# Patient Record
Sex: Male | Born: 1950 | Race: Black or African American | Hispanic: No | Marital: Single | Smoking: Former smoker
Health system: Southern US, Community
[De-identification: ages and names within clinical notes are randomized; demographics above are authoritative.]

## PROBLEM LIST (undated history)

## (undated) DIAGNOSIS — I10 Essential (primary) hypertension: Secondary | ICD-10-CM

## (undated) HISTORY — PX: FOOT SURGERY: SHX648

---

## 2011-10-25 ENCOUNTER — Emergency Department: Payer: Self-pay | Admitting: Emergency Medicine

## 2016-06-12 ENCOUNTER — Emergency Department
Admission: EM | Admit: 2016-06-12 | Discharge: 2016-06-12 | Disposition: A | Discharge status: Home / Self Care | Payer: Self-pay | Attending: Emergency Medicine | Admitting: Emergency Medicine

## 2016-06-12 ENCOUNTER — Encounter: Payer: Self-pay | Admitting: Emergency Medicine

## 2016-06-12 DIAGNOSIS — I10 Essential (primary) hypertension: Secondary | ICD-10-CM | POA: Insufficient documentation

## 2016-06-12 DIAGNOSIS — Z87891 Personal history of nicotine dependence: Secondary | ICD-10-CM | POA: Insufficient documentation

## 2016-06-12 DIAGNOSIS — E860 Dehydration: Secondary | ICD-10-CM | POA: Insufficient documentation

## 2016-06-12 DIAGNOSIS — T675XXA Heat exhaustion, unspecified, initial encounter: Secondary | ICD-10-CM | POA: Insufficient documentation

## 2016-06-12 HISTORY — DX: Essential (primary) hypertension: I10

## 2016-06-12 LAB — BASIC METABOLIC PANEL
ANION GAP: 9 (ref 5–15)
BUN: 17 mg/dL (ref 6–20)
CHLORIDE: 104 mmol/L (ref 101–111)
CO2: 25 mmol/L (ref 22–32)
Calcium: 9.2 mg/dL (ref 8.9–10.3)
Creatinine, Ser: 1.35 mg/dL — ABNORMAL HIGH (ref 0.61–1.24)
GFR calc Af Amer: >60 mL/min (ref >60)
GFR calc non Af Amer: 54 mL/min — ABNORMAL LOW (ref >60)
GLUCOSE: 98 mg/dL (ref 65–99)
POTASSIUM: 3.5 mmol/L (ref 3.5–5.1)
SODIUM: 138 mmol/L (ref 135–145)

## 2016-06-12 LAB — URINALYSIS, COMPLETE (UACMP) WITH MICROSCOPIC
Bacteria, UA: NONE SEEN
Bilirubin Urine: NEGATIVE
GLUCOSE, UA: NEGATIVE mg/dL
HGB URINE DIPSTICK: NEGATIVE
Ketones, ur: NEGATIVE mg/dL
LEUKOCYTES UA: NEGATIVE
NITRITE: NEGATIVE
PH: 5 (ref 5.0–8.0)
Protein, ur: NEGATIVE mg/dL
SPECIFIC GRAVITY, URINE: 1.017 (ref 1.005–1.030)

## 2016-06-12 LAB — CK: Total CK: 247 U/L (ref 49–397)

## 2016-06-12 LAB — CBC
HEMATOCRIT: 43.1 % (ref 40.0–52.0)
HEMOGLOBIN: 14.5 g/dL (ref 13.0–18.0)
MCH: 30.3 pg (ref 26.0–34.0)
MCHC: 33.7 g/dL (ref 32.0–36.0)
MCV: 89.7 fL (ref 80.0–100.0)
Platelets: 289 10*3/uL (ref 150–440)
RBC: 4.8 MIL/uL (ref 4.40–5.90)
RDW: 14 % (ref 11.5–14.5)
WBC: 7.4 10*3/uL (ref 3.8–10.6)

## 2016-06-12 MED ORDER — SODIUM CHLORIDE 0.9 % IV BOLUS (SEPSIS)
1000.0000 mL | Freq: Once | INTRAVENOUS | Status: AC
  Administered 2016-06-12: 1000 mL via INTRAVENOUS

## 2016-06-12 NOTE — ED Provider Notes (Signed)
[facility] Emergency Department Provider Note  ____________________________________________  Time seen: Approximately 8:17 PM  I have reviewed the triage vital signs and the nursing notes.   HISTORY  Chief Complaint Near Syncope and Diarrhea    HPI Terry SorrowJoseph M Kolarik Jr. is a 66 y.o. male who complains of an episode of getting dizzy while mowing the lawn today. He reports that he had not eaten anything today and had been drinking some warm water while outside on 90 a day mowing the grass for about 3 hours. He also had some loose bowel movements and felt real dizzy. He did not pass out. No chest pain shortness of breath back pain abdominal pain. No headache or numbness tingling weakness or vision changes. He just feels weak all over. No falls or trauma.     Past Medical History:  Diagnosis Date  . Hypertension      There are no active problems to display for this patient.    Past Surgical History:  Procedure Laterality Date  . FOOT SURGERY       Prior to Admission medications   Not on File     Allergies Other   No family history on file.  Social History Social History  Substance Use Topics  . Smoking status: Former Games developermoker  . Smokeless tobacco: Never Used  . Alcohol use No    Review of Systems  Constitutional:   No fever or chills.  ENT:   No sore throat. No rhinorrhea. Lymphatic: No swollen glands, No extremity swelling Endocrine: No hot/cold flashes. No significant weight change. No neck swelling. Cardiovascular:   No chest pain or syncope. Respiratory:   No dyspnea or cough. Gastrointestinal:   Negative for abdominal pain, vomiting and diarrhea.  Genitourinary:   Negative for dysuria or difficulty urinating. Musculoskeletal:   Negative for focal pain or swelling Neurological:   Negative for headaches or weakness. All other systems reviewed and are negative except as documented above in ROS and  HPI.  ____________________________________________   PHYSICAL EXAM:  VITAL SIGNS: ED Triage Vitals [06/12/16 1705]  Enc Vitals Group     BP (!) 113/93     Pulse Rate 98     Resp 18     Temp 99.6 F (37.6 C)     Temp Source Oral     SpO2 95 %     Weight 192 lb (87.1 kg)     Height 5' 11.5" (1.816 m)     Head Circumference      Peak Flow      Pain Score 0     Pain Loc      Pain Edu?      Excl. in GC?     Vital signs reviewed, nursing assessments reviewed.   Constitutional:   Alert and oriented. Well appearing and in no distress. Eyes:   No scleral icterus. No conjunctival pallor. PERRL. EOMI.  No nystagmus. ENT   Head:   Normocephalic and atraumatic.   Nose:   No congestion/rhinnorhea. No septal hematoma   Mouth/Throat:   MMM, no pharyngeal erythema. No peritonsillar mass.    Neck:   No stridor. No SubQ emphysema. No meningismus. Hematological/Lymphatic/Immunilogical:   No cervical lymphadenopathy. Cardiovascular:   RRR. Symmetric bilateral radial and DP pulses.  No murmurs.  Respiratory:   Normal respiratory effort without tachypnea nor retractions. Breath sounds are clear and equal bilaterally. No wheezes/rales/rhonchi. Gastrointestinal:   Soft and nontender. Non distended. There is no CVA tenderness.  No  rebound, rigidity, or guarding. Genitourinary:   deferred Musculoskeletal:   Normal range of motion in all extremities. No joint effusions.  No lower extremity tenderness.  No edema. Neurologic:   Normal speech and language.  CN 2-10 normal. Motor grossly intact. No gross focal neurologic deficits are appreciated.  Skin:    Skin is warm, dry and intact. No rash noted.  No petechiae, purpura, or bullae.  ____________________________________________    LABS (pertinent positives/negatives) (all labs ordered are listed, but only abnormal results are displayed) Labs Reviewed  BASIC METABOLIC PANEL - Abnormal; Notable for the following:       Result  Value   Creatinine, Ser 1.35 (*)    GFR calc non Af Amer 54 (*)    All other components within normal limits  URINALYSIS, COMPLETE (UACMP) WITH MICROSCOPIC - Abnormal; Notable for the following:    Color, Urine YELLOW (*)    APPearance CLEAR (*)    Squamous Epithelial / LPF 0-5 (*)    All other components within normal limits  CBC  CK  CBG MONITORING, ED   ____________________________________________   EKG  Interpreted by me Normal sinus rhythm rate of 89, normal axis intervals QRS ST segments and T waves.  ____________________________________________    RADIOLOGY  No results found.  ____________________________________________   PROCEDURES Procedures  ____________________________________________   INITIAL IMPRESSION / ASSESSMENT AND PLAN / ED COURSE  Pertinent labs & imaging results that were available during my care of the patient were reviewed by me and considered in my medical decision making (see chart for details).  Patient well appearing no acute distress, exam unremarkable vital signs normal. Likely heat exhaustion and dehydration. We'll give IV fluids, check orthostatics, check labs and CK.  ----------------------------------------- 8:18 PM on 06/12/2016 -----------------------------------------  Blood pressure increased at baseline which is mildly elevated. Orthostatics negative. Labs unremarkable. We'll discharge home.         ____________________________________________   FINAL CLINICAL IMPRESSION(S) / ED DIAGNOSES  Final diagnoses:  Dehydration  Heat exhaustion, initial encounter      New Prescriptions   No medications on file     Portions of this note were generated with dragon dictation software. Dictation errors may occur despite best attempts at proofreading.    Sharman Cheek, MD 06/12/16 2019

## 2016-06-12 NOTE — ED Triage Notes (Signed)
Patient presents to the ED after an episode where he almost passed out after mowing the lawn.  Patient reports 3 episodes of diarrhea today and states he has not had anything to eat.  Patient denies any pain.  Denies vomiting.

## 2018-07-09 ENCOUNTER — Emergency Department: Payer: Medicare Other

## 2018-07-09 ENCOUNTER — Other Ambulatory Visit: Payer: Self-pay

## 2018-07-09 ENCOUNTER — Emergency Department
Admission: EM | Admit: 2018-07-09 | Discharge: 2018-07-09 | Disposition: A | Discharge status: Home / Self Care | Payer: Medicare Other | Attending: Emergency Medicine | Admitting: Emergency Medicine

## 2018-07-09 DIAGNOSIS — Z87891 Personal history of nicotine dependence: Secondary | ICD-10-CM | POA: Insufficient documentation

## 2018-07-09 DIAGNOSIS — Y92410 Unspecified street and highway as the place of occurrence of the external cause: Secondary | ICD-10-CM | POA: Insufficient documentation

## 2018-07-09 DIAGNOSIS — Y999 Unspecified external cause status: Secondary | ICD-10-CM | POA: Diagnosis not present

## 2018-07-09 DIAGNOSIS — S161XXA Strain of muscle, fascia and tendon at neck level, initial encounter: Secondary | ICD-10-CM | POA: Insufficient documentation

## 2018-07-09 DIAGNOSIS — Y9389 Activity, other specified: Secondary | ICD-10-CM | POA: Diagnosis not present

## 2018-07-09 DIAGNOSIS — I10 Essential (primary) hypertension: Secondary | ICD-10-CM | POA: Diagnosis not present

## 2018-07-09 DIAGNOSIS — M25511 Pain in right shoulder: Secondary | ICD-10-CM | POA: Insufficient documentation

## 2018-07-09 DIAGNOSIS — S39012A Strain of muscle, fascia and tendon of lower back, initial encounter: Secondary | ICD-10-CM

## 2018-07-09 DIAGNOSIS — S199XXA Unspecified injury of neck, initial encounter: Secondary | ICD-10-CM | POA: Diagnosis present

## 2018-07-09 MED ORDER — MELOXICAM 7.5 MG PO TABS: 7.5000 mg | ORAL_TABLET | Freq: Every day | ORAL | 0 refills | Status: AC

## 2018-07-09 MED ORDER — MELOXICAM 7.5 MG PO TABS
7.5000 mg | ORAL_TABLET | Freq: Once | ORAL | Status: AC
  Administered 2018-07-09: 7.5 mg via ORAL
  Filled 2018-07-09: qty 1

## 2018-07-09 MED ORDER — METHOCARBAMOL 500 MG PO TABS: 500.0000 mg | ORAL_TABLET | Freq: Four times a day (QID) | ORAL | 0 refills | Status: AC
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
## 2018-07-14 ENCOUNTER — Other Ambulatory Visit: Payer: Self-pay

## 2018-07-14 ENCOUNTER — Emergency Department
Admission: EM | Admit: 2018-07-14 | Discharge: 2018-07-14 | Disposition: A | Discharge status: Home / Self Care | Payer: No Typology Code available for payment source | Attending: Emergency Medicine | Admitting: Emergency Medicine

## 2018-07-14 ENCOUNTER — Encounter: Payer: Self-pay | Admitting: Emergency Medicine

## 2018-07-14 DIAGNOSIS — Z79899 Other long term (current) drug therapy: Secondary | ICD-10-CM | POA: Diagnosis not present

## 2018-07-14 DIAGNOSIS — Z87891 Personal history of nicotine dependence: Secondary | ICD-10-CM | POA: Diagnosis not present

## 2018-07-14 DIAGNOSIS — M7918 Myalgia, other site: Secondary | ICD-10-CM | POA: Insufficient documentation

## 2018-07-14 DIAGNOSIS — I10 Essential (primary) hypertension: Secondary | ICD-10-CM | POA: Insufficient documentation

## 2018-07-14 MED ORDER — KETOROLAC TROMETHAMINE 30 MG/ML IJ SOLN
30.0000 mg | Freq: Once | INTRAMUSCULAR | Status: AC
  Administered 2018-07-14: 11:00:00 30 mg via INTRAMUSCULAR
  Filled 2018-07-14: qty 1

## 2018-07-14 MED ORDER — ORPHENADRINE CITRATE 30 MG/ML IJ SOLN
60.0000 mg | Freq: Two times a day (BID) | INTRAMUSCULAR | Status: DC
  Administered 2018-07-14: 11:00:00 60 mg via INTRAMUSCULAR
  Filled 2018-07-14: qty 2
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
## 2018-07-14 NOTE — Discharge Instructions (Addendum)
Follow discharge care instruction and start medication as directed.
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
## 2018-07-18 ENCOUNTER — Encounter: Payer: Self-pay | Admitting: Emergency Medicine

## 2018-07-18 ENCOUNTER — Emergency Department
Admission: EM | Admit: 2018-07-18 | Discharge: 2018-07-18 | Disposition: A | Discharge status: Home / Self Care | Payer: No Typology Code available for payment source | Attending: Student in an Organized Health Care Education/Training Program | Admitting: Student in an Organized Health Care Education/Training Program

## 2018-07-18 ENCOUNTER — Emergency Department: Payer: No Typology Code available for payment source

## 2018-07-18 ENCOUNTER — Other Ambulatory Visit: Payer: Self-pay

## 2018-07-18 DIAGNOSIS — Z87891 Personal history of nicotine dependence: Secondary | ICD-10-CM | POA: Insufficient documentation

## 2018-07-18 DIAGNOSIS — I10 Essential (primary) hypertension: Secondary | ICD-10-CM | POA: Diagnosis not present

## 2018-07-18 DIAGNOSIS — R51 Headache: Secondary | ICD-10-CM | POA: Diagnosis present

## 2018-07-18 DIAGNOSIS — G44209 Tension-type headache, unspecified, not intractable: Secondary | ICD-10-CM | POA: Diagnosis not present

## 2018-07-18 MED ORDER — DICLOFENAC SODIUM 25 MG PO TBEC: 25.0000 mg | DELAYED_RELEASE_TABLET | Freq: Two times a day (BID) | ORAL | 0 refills | Status: AC
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
## 2022-09-09 ENCOUNTER — Ambulatory Visit: Payer: Medicare PPO

## 2022-09-10 ENCOUNTER — Ambulatory Visit: Payer: Medicare PPO

## 2022-09-10 ENCOUNTER — Encounter: Payer: Self-pay | Admitting: Family Medicine

## 2022-09-10 ENCOUNTER — Ambulatory Visit: Payer: Medicare PPO | Admitting: Family Medicine

## 2022-09-10 DIAGNOSIS — Z202 Contact with and (suspected) exposure to infections with a predominantly sexual mode of transmission: Secondary | ICD-10-CM

## 2022-09-10 DIAGNOSIS — Z113 Encounter for screening for infections with a predominantly sexual mode of transmission: Secondary | ICD-10-CM

## 2022-09-10 LAB — HM HIV SCREENING LAB: HM HIV Screening: NEGATIVE

## 2022-09-10 MED ORDER — METRONIDAZOLE 500 MG PO TABS: 2000.0000 mg | ORAL_TABLET | Freq: Once | ORAL | Status: AC

## 2022-09-10 NOTE — Progress Notes (Signed)
[facility] Department STI clinic/screening visit  Subjective:  Rocket Wolfe. is a 72 y.o. male being seen today for an STI screening visit. The patient reports they do not have symptoms.    Patient has the following medical conditions:  There are no problems to display for this patient.    Chief Complaint  Patient presents with   SEXUALLY TRANSMITTED DISEASE    Contact to trich    HPI  Patient reports to clinic as a contact to trich and for STI testing  Last HIV test per patient/review of record was No results found for: "HMHIVSCREEN" No results found for: "HIV"  Does the patient or their partner desires a pregnancy in the next year? No  Screening for MPX risk: Does the patient have an unexplained rash? No Is the patient MSM? No Does the patient endorse multiple sex partners or anonymous sex partners? No Did the patient have close or sexual contact with a person diagnosed with MPX? No Has the patient traveled outside the [place] where MPX is endemic? No Is there a high clinical suspicion for MPX-- evidenced by one of the following No  -Unlikely to be chickenpox  -Lymphadenopathy  -Rash that present in same phase of evolution on any given body part   See flowsheet for further details and programmatic requirements.   Immunization History  Administered Date(s) Administered   Td (Adult),unspecified 03/04/2004   Tdap 06/10/2021     The following portions of the patient's history were reviewed and updated as appropriate: allergies, current medications, past medical history, past social history, past surgical history and problem list.  Objective:  There were no vitals filed for this visit.  Physical Exam Vitals and nursing note reviewed.  Constitutional:      Appearance: Normal appearance.  HENT:     Head: Normocephalic and atraumatic.     Mouth/Throat:     Mouth: Mucous membranes are moist.     Pharynx: No oropharyngeal exudate or posterior oropharyngeal  erythema.  Eyes:     General:        Right eye: No discharge.        Left eye: No discharge.     Conjunctiva/sclera:     Right eye: Right conjunctiva is not injected. No exudate.    Left eye: Left conjunctiva is not injected. No exudate. Pulmonary:     Effort: Pulmonary effort is normal.  Abdominal:     General: Abdomen is flat.     Palpations: Abdomen is soft. There is no hepatomegaly or mass.     Tenderness: There is no abdominal tenderness. There is no rebound.  Genitourinary:    Comments: Declined genital exam- asymptomatic Lymphadenopathy:     Cervical: No cervical adenopathy.     Upper Body:     Right upper body: No supraclavicular or axillary adenopathy.     Left upper body: No supraclavicular or axillary adenopathy.  Skin:    General: Skin is warm and dry.  Neurological:     Mental Status: He is alert and oriented to person, place, and time.       Assessment and Plan:  Alok Mezzanotte. is a 72 y.o. male presenting to the [facility] Department for STI screening  1. Exposure to trichomonas  - metroNIDAZOLE (FLAGYL) 500 MG tablet; Take 4 tablets (2,000 mg total) by mouth once for 1 dose.  2. Screening for venereal disease  - HIV [facility] - Syphilis Serology, [facility] -  Chlamydia/GC NAA, Confirmation   Patient does not have STI symptoms Patient accepted all screenings including  urine GC/Chlamydia, and blood work for HIV/Syphilis. Patient meets criteria for HepB screening? No. Ordered? not applicable Patient meets criteria for HepC screening? Yes. Ordered? Practitioner oversight- forgot to ask and order Recommended condom use with all sex Discussed importance of condom use for STI prevent  Treat positive test results per standing order. Discussed time line for [facility] results and that patient will be called with positive results and encouraged patient to call if he had not heard in 2 weeks Recommended repeat testing in 3 months with  positive results. Recommended returning for continued or worsening symptoms.   Return if symptoms worsen or fail to improve, for STI screening.  No future appointments. Total time spent 15 minutes  Lenice Llamas, [place]

## 2022-09-10 NOTE — Progress Notes (Signed)
Pt is here for std screening, condoms given.  The patient was dispensed metronidazole today. I provided counseling today regarding the medication. We discussed the medication, the side effects and when to call clinic. Patient given the opportunity to ask questions. Questions answered.   Gaspar Garbe, RN

## 2024-01-23 ENCOUNTER — Other Ambulatory Visit: Payer: Self-pay

## 2024-01-23 MED ORDER — TADALAFIL 5 MG PO TABS
5.0000 mg | ORAL_TABLET | Freq: Every day | ORAL | 0 refills | Status: AC | PRN
  Filled 2024-01-23: qty 10, 30d supply, fill #0
  Filled 2024-01-30 – 2024-02-13 (×2): qty 80, 80d supply, fill #1

## 2024-01-30 ENCOUNTER — Other Ambulatory Visit: Payer: Self-pay

## 2024-02-09 ENCOUNTER — Other Ambulatory Visit: Payer: Self-pay

## 2024-02-13 ENCOUNTER — Other Ambulatory Visit: Payer: Self-pay

## 2024-03-20 ENCOUNTER — Ambulatory Visit: Admit: 2024-03-20 | Admitting: Ophthalmology

## 2024-04-03 ENCOUNTER — Ambulatory Visit: Admit: 2024-04-03 | Admitting: Ophthalmology
